# Patient Record
Sex: Female | Born: 1960 | Race: White | Hispanic: No | Marital: Married | Smoking: Never smoker
Health system: Southern US, Community
[De-identification: ages and names within clinical notes are randomized; demographics above are authoritative.]

## PROBLEM LIST (undated history)

## (undated) DIAGNOSIS — R51 Headache: Secondary | ICD-10-CM

## (undated) DIAGNOSIS — I10 Essential (primary) hypertension: Secondary | ICD-10-CM

## (undated) DIAGNOSIS — F329 Major depressive disorder, single episode, unspecified: Secondary | ICD-10-CM

## (undated) DIAGNOSIS — R519 Headache, unspecified: Secondary | ICD-10-CM

## (undated) DIAGNOSIS — G8929 Other chronic pain: Secondary | ICD-10-CM

## (undated) DIAGNOSIS — F32A Depression, unspecified: Secondary | ICD-10-CM

## (undated) DIAGNOSIS — E7800 Pure hypercholesterolemia, unspecified: Secondary | ICD-10-CM

## (undated) DIAGNOSIS — R251 Tremor, unspecified: Secondary | ICD-10-CM

## (undated) DIAGNOSIS — M1990 Unspecified osteoarthritis, unspecified site: Secondary | ICD-10-CM

## (undated) DIAGNOSIS — R87619 Unspecified abnormal cytological findings in specimens from cervix uteri: Secondary | ICD-10-CM

## (undated) HISTORY — DX: Headache: R51

## (undated) HISTORY — DX: Other chronic pain: G89.29

## (undated) HISTORY — DX: Essential (primary) hypertension: I10

## (undated) HISTORY — DX: Major depressive disorder, single episode, unspecified: F32.9

## (undated) HISTORY — DX: Tremor, unspecified: R25.1

## (undated) HISTORY — DX: Unspecified osteoarthritis, unspecified site: M19.90

## (undated) HISTORY — PX: TUBAL LIGATION: SHX77

## (undated) HISTORY — DX: Headache, unspecified: R51.9

## (undated) HISTORY — DX: Depression, unspecified: F32.A

## (undated) HISTORY — DX: Unspecified abnormal cytological findings in specimens from cervix uteri: R87.619

## (undated) HISTORY — DX: Pure hypercholesterolemia, unspecified: E78.00

---

## 2006-11-06 ENCOUNTER — Ambulatory Visit: Payer: Self-pay | Admitting: Internal Medicine

## 2007-01-09 ENCOUNTER — Ambulatory Visit: Payer: Self-pay | Admitting: Family Medicine

## 2013-03-15 ENCOUNTER — Ambulatory Visit: Payer: Self-pay | Admitting: Family Medicine

## 2014-02-09 HISTORY — PX: BREAST BIOPSY: SHX20

## 2015-07-17 IMAGING — US ABDOMEN ULTRASOUND
1 series · 14 of 25 positions shown · non-contrast
Comparison: None.

CLINICAL DATA: Jaundice, fever and vomiting.

EXAM:
ULTRASOUND ABDOMEN COMPLETE

[Series 1: abdomen ultrasound · 0.27mm/px · 14 of 101 slices shown]
[im 1/101]
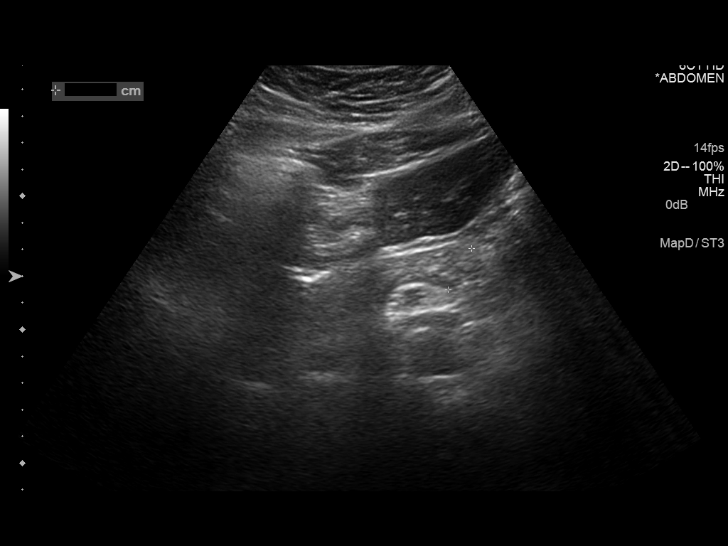
[im 9/101]
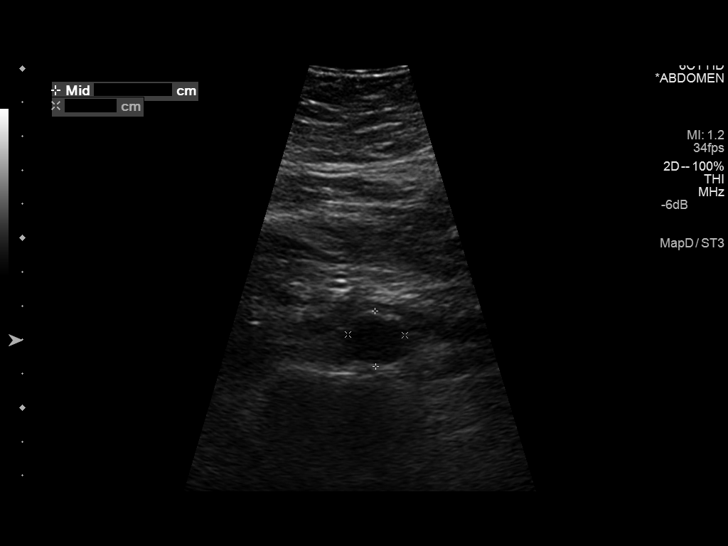
[im 17/101]
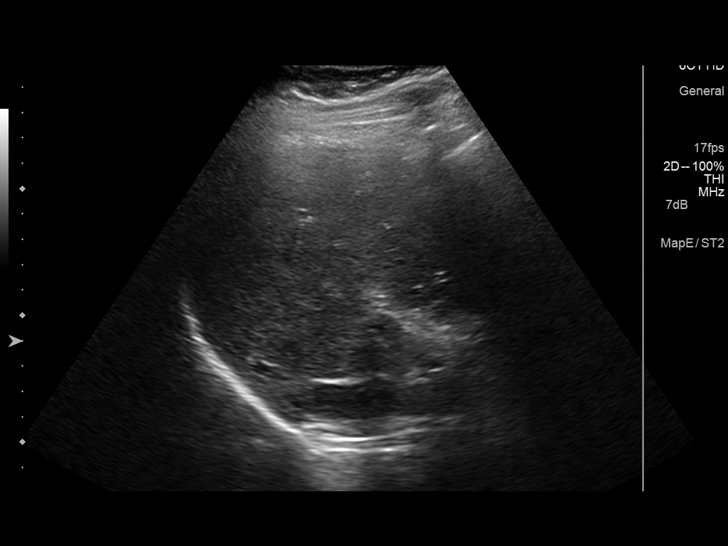
[im 26/101]
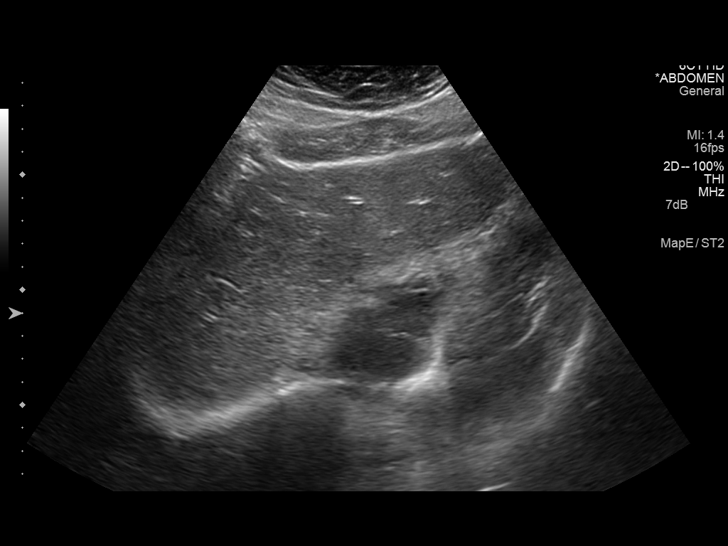
[im 34/101]
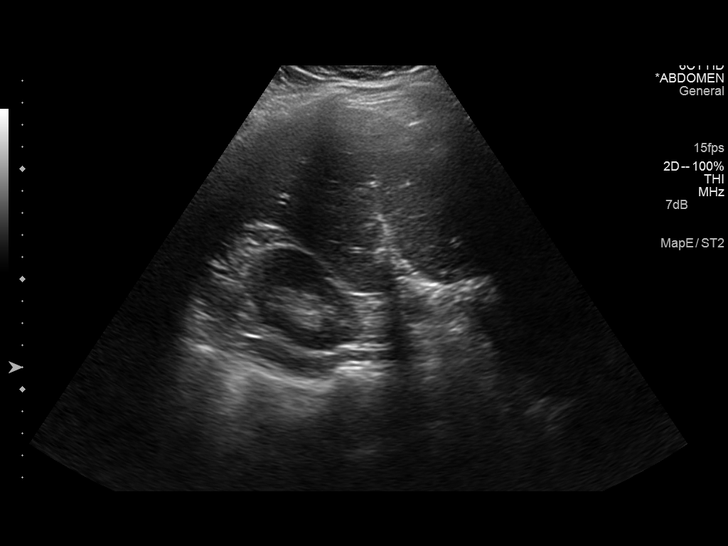
[im 38/101]
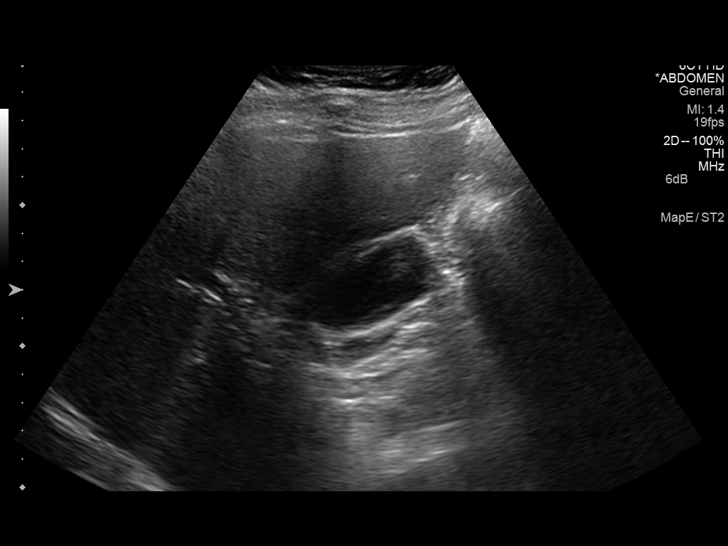
[im 46/101]
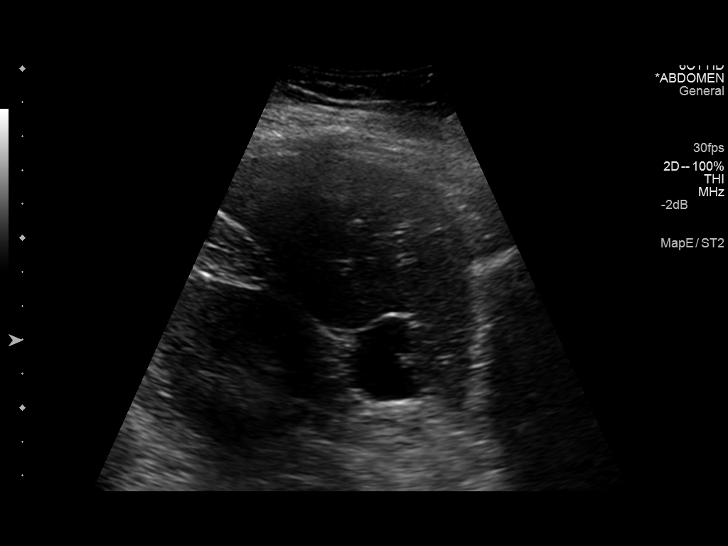
[im 55/101]
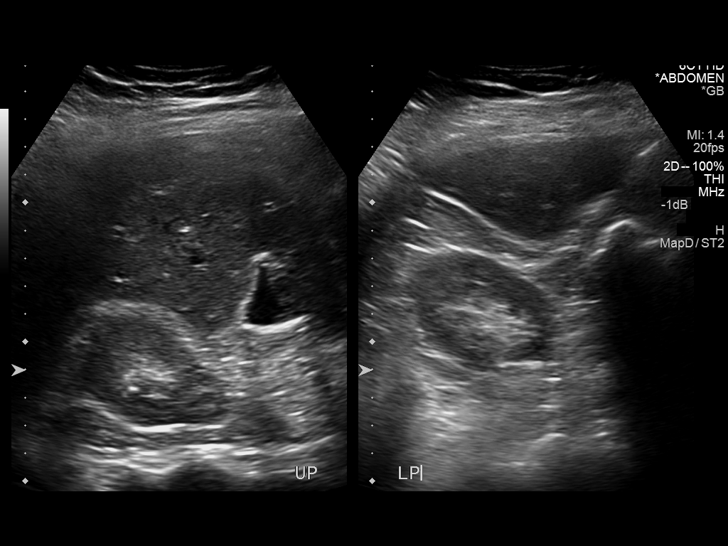
[im 63/101]
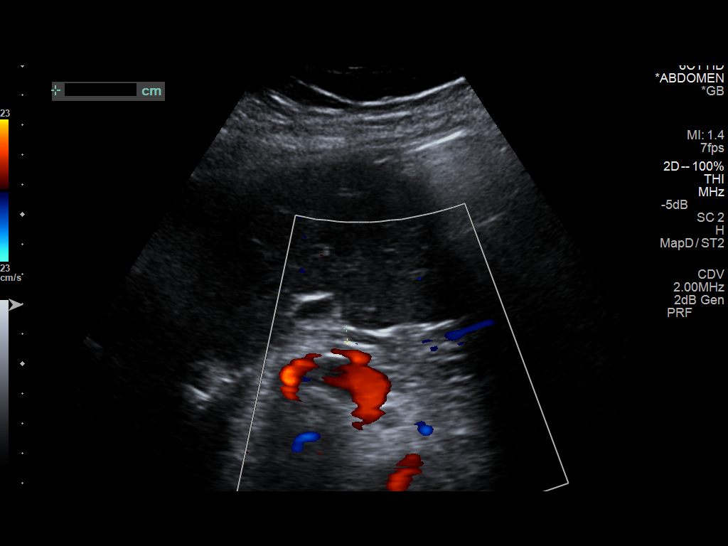
[im 67/101]
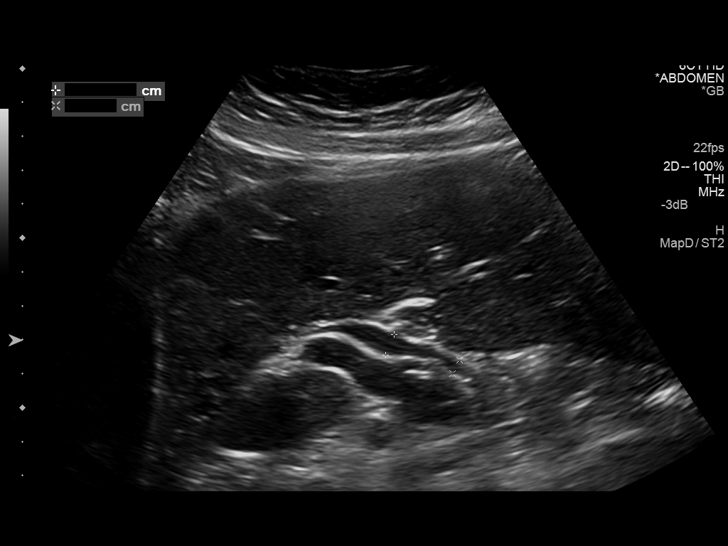
[im 76/101]
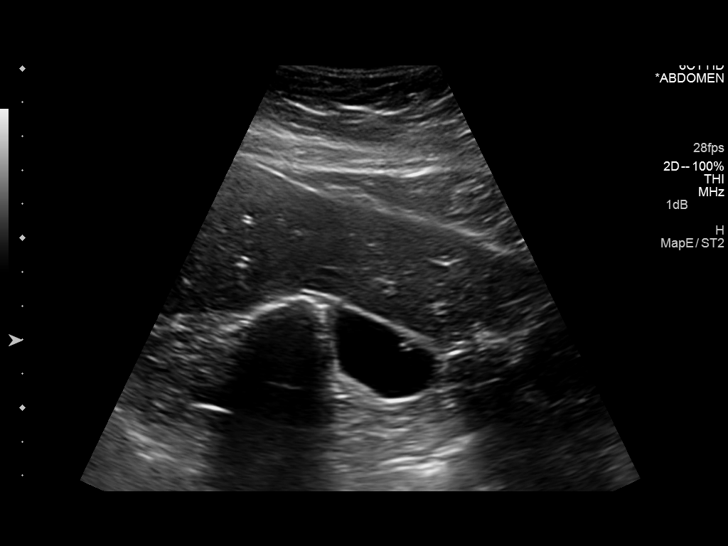
[im 84/101]
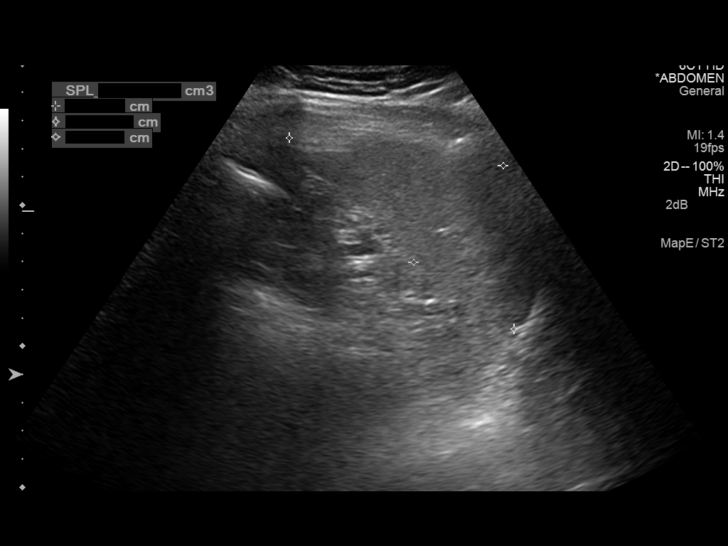
[im 92/101]
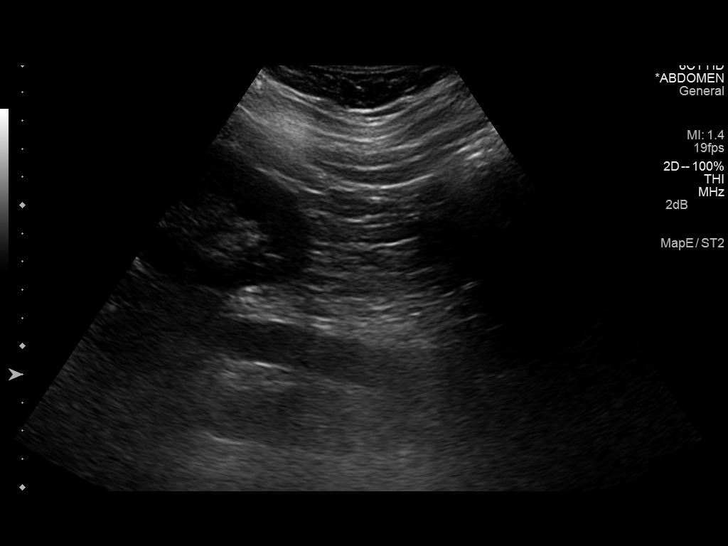
[im 101/101]
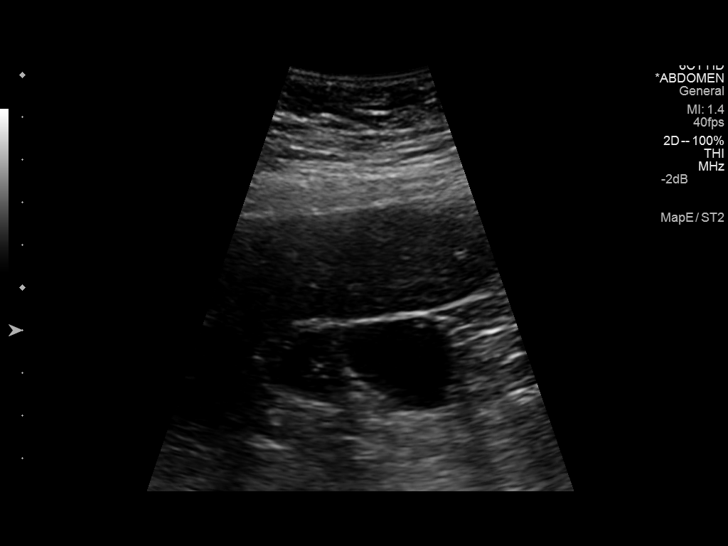

[14 of 25 positions shown; findings below may reference images not displayed]

FINDINGS: Gallbladder:

No gallstones or wall thickening visualized. Tiny nonmobile polyp
identified measuring approximately 4 mm. No sonographic Murphy sign
noted.

Common bile duct:

Diameter: 3-6 mm.

Liver:

Within normal limits in parenchymal echogenicity. No masses or
intrahepatic biliary ductal dilatation identified. The portal vein
is open.

IVC:

No abnormality visualized.

Pancreas:

Visualized portion unremarkable.

Spleen:

Size and appearance within normal limits.

Right Kidney:

Length: 10.5 cm. Echogenicity within normal limits. No mass or
hydronephrosis visualized.

Left Kidney:

Length: 12.6 cm. Echogenicity within normal limits. No mass or
hydronephrosis visualized.

Abdominal aorta:

No aneurysm visualized.

Other findings:

None.
IMPRESSION: Small 4 mm gallbladder polyp. No evidence of biliary obstruction or
hepatic abnormality by ultrasound.

## 2017-12-09 ENCOUNTER — Telehealth: Payer: Self-pay | Admitting: Obstetrics & Gynecology

## 2017-12-09 NOTE — Telephone Encounter (Signed)
[facility] [place] [facility] referring for Abnormal Cervical Papanicolaur smear, unspcified abnormal pap finding. Called and left voicemail for patient to call back to be schedule.( Paper Records)

## 2017-12-27 ENCOUNTER — Ambulatory Visit (INDEPENDENT_AMBULATORY_CARE_PROVIDER_SITE_OTHER): Payer: BLUE CROSS/BLUE SHIELD | Admitting: Obstetrics and Gynecology

## 2017-12-27 ENCOUNTER — Other Ambulatory Visit (HOSPITAL_COMMUNITY)
Admission: RE | Admit: 2017-12-27 | Discharge: 2017-12-27 | Disposition: A | Discharge status: Home / Self Care | Payer: BLUE CROSS/BLUE SHIELD | Source: Ambulatory Visit | Attending: Obstetrics and Gynecology | Admitting: Obstetrics and Gynecology

## 2017-12-27 ENCOUNTER — Encounter: Payer: Self-pay | Admitting: Obstetrics and Gynecology

## 2017-12-27 VITALS — BP 124/74 | Ht 66.0 in | Wt 194.0 lb

## 2017-12-27 DIAGNOSIS — R87810 Cervical high risk human papillomavirus (HPV) DNA test positive: Secondary | ICD-10-CM | POA: Insufficient documentation

## 2017-12-27 DIAGNOSIS — N870 Mild cervical dysplasia: Secondary | ICD-10-CM

## 2017-12-27 DIAGNOSIS — R87610 Atypical squamous cells of undetermined significance on cytologic smear of cervix (ASC-US): Secondary | ICD-10-CM | POA: Diagnosis present

## 2017-12-27 NOTE — Progress Notes (Signed)
Referring Provider:  Etheleen Nicks, FNP, from [facility] [place]  HPI:  Alice Ponce is a 57 y.o.  G2P2  who presents today in referral from Etheleen Nicks, FNP, for evaluation and management of abnormal cervical cytology.    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 Abnormal Pap smear of cervix   . Arthritis   . Benign essential hypertension   . Chronic headache   . Depression   . Hypercholesterolemia   . Hypertension   . Tremor   . Tremors of nervous system     Past Surgical History:  Procedure Laterality Date  . BREAST BIOPSY  2016  . TUBAL LIGATION      Social History   Socioeconomic History  . Marital status: Married    Spouse name: Not on file  . Number of children: Not on file  . Years of education: Not on file  . Highest education level: Not on file  Occupational History  . Not on file  Social Needs  . Financial resource strain: Not on file  . Food insecurity:    Worry: Not on file    Inability: Not on file  . Transportation needs:    Medical: Not on file    Non-medical: Not on file  Tobacco Use  . Smoking status: Never Smoker  . Smokeless tobacco: Never Used  Substance and Sexual Activity  . Alcohol use: Never    Frequency: Never  . Drug use: Never  . Sexual activity: Not on file  Lifestyle  . Physical activity:    Days per week: Not on file    Minutes per session: Not on file  . Stress: Not on file  Relationships  . Social connections:    Talks on phone: Not on file    Gets together: Not on file    Attends religious service: Not on file    Active member of club or organization: Not on file    Attends meetings of clubs or  organizations: Not on file    Relationship status: Not on file  Other Topics Concern  . Not on file  Social History Narrative  . Not on file     Family History  Problem Relation Age of Onset  . Breast cancer Mother   . Hypertension Mother   . Hypertension Father   . Heart disease Father   . Breast cancer Maternal Grandmother     ALLERGIES:  Fluoxetine  Current Outpatient Medications on File Prior to Visit  Medication Sig Dispense Refill  . hydrochlorothiazide (HYDRODIURIL) 25 MG tablet Take by mouth.    Marland Kitchen lisinopril (PRINIVIL,ZESTRIL) 5 MG tablet Take by mouth.    . propranolol (INDERAL) 80 MG tablet Take by mouth.     No current facility-administered medications on file prior to visit.     Physical Exam: -Vitals:  BP 124/74   Ht 5\' 6"  (1.676 m)   Wt 194 lb (  88 kg)   BMI 31.31 kg/m  GEN: WD, WN, NAD.  A+ O x 3, good mood and affect. ABD:  NT, ND.  Soft, no masses.  No hernias noted.   Pelvic:   Vulva: Normal appearance.  No lesions.  Vagina: No lesions or abnormalities noted.  Support: Normal pelvic support.  Urethra No masses tenderness or scarring.  Meatus Normal size without lesions or prolapse.  Cervix: See below.  Anus: Normal exam.  No lesions.  Perineum: Normal exam.  No lesions.        Bimanual   Uterus: Normal size.  Non-tender.  Mobile.  AV.  Adnexae: No masses.  Non-tender to palpation.  Cul-de-sac: Negative for abnormality.   PROCEDURE: 1.  Urine Pregnancy Test:  not done 2.  Colposcopy performed with 4% acetic acid after verbal consent obtained                                         -Aceto-white Lesions Location(s): diffusely, especially at 6-9 and about 10 o'clock.              -Biopsy performed at 6, 8, 10, and 1 o'clock               -ECC indicated and performed: Yes.       -Biopsy sites made hemostatic with pressure, and/or Monsel's solution   -Satisfactory colposcopy: No.    -Evidence of Invasive cervical CA :  NO  ASSESSMENT:   Alice Ponce is a 57 y.o. No obstetric history on file. here for  1. ASCUS with positive high risk HPV cervical   .  PLAN: I discussed the grading system of pap smears and HPV high risk viral types.  We will discuss and base management after colpo results return.     Thomasene MohairStephen Wandell Scullion, MD  [facility], [facility] 12/27/2017  2:12 PM   CC: Etheleen NicksMacDonald, Keri, NP [address] [place], [place] [id number]

## 2018-01-12 ENCOUNTER — Telehealth: Payer: Self-pay | Admitting: Obstetrics and Gynecology

## 2018-01-12 NOTE — Telephone Encounter (Signed)
Left generic VM to call me back 

## 2018-01-21 NOTE — Telephone Encounter (Signed)
Spoke with patient and explained results of CIN 1. Recommend follow up pap smear in one year.  Alice MohairStephen Gerilynn Mccullars, MD, Merlinda FrederickFACOG [facility], [facility] 01/21/2018 1:07 PM
